# Patient Record
Sex: Female | Born: 1945 | Race: White | Hispanic: No | Marital: Married | Smoking: Former smoker
Health system: Southern US, Community
[De-identification: ages and names within clinical notes are randomized; demographics above are authoritative.]

## PROBLEM LIST (undated history)

## (undated) DIAGNOSIS — A0472 Enterocolitis due to Clostridium difficile, not specified as recurrent: Secondary | ICD-10-CM

## (undated) DIAGNOSIS — K589 Irritable bowel syndrome without diarrhea: Secondary | ICD-10-CM

## (undated) DIAGNOSIS — K635 Polyp of colon: Secondary | ICD-10-CM

## (undated) DIAGNOSIS — E079 Disorder of thyroid, unspecified: Secondary | ICD-10-CM

## (undated) DIAGNOSIS — K449 Diaphragmatic hernia without obstruction or gangrene: Secondary | ICD-10-CM

## (undated) DIAGNOSIS — M797 Fibromyalgia: Secondary | ICD-10-CM

## (undated) DIAGNOSIS — M109 Gout, unspecified: Secondary | ICD-10-CM

## (undated) HISTORY — DX: Gout, unspecified: M10.9

## (undated) HISTORY — DX: Enterocolitis due to Clostridium difficile, not specified as recurrent: A04.72

## (undated) HISTORY — DX: Irritable bowel syndrome without diarrhea: K58.9

## (undated) HISTORY — DX: Disorder of thyroid, unspecified: E07.9

## (undated) HISTORY — DX: Fibromyalgia: M79.7

## (undated) HISTORY — DX: Polyp of colon: K63.5

## (undated) HISTORY — DX: Diaphragmatic hernia without obstruction or gangrene: K44.9

---

## 1989-09-23 HISTORY — PX: DIAGNOSTIC LAPAROSCOPY: SUR761

## 1989-10-23 HISTORY — PX: TOTAL ABDOMINAL HYSTERECTOMY W/ BILATERAL SALPINGOOPHORECTOMY: SHX83

## 1997-11-23 HISTORY — PX: LAPAROSCOPIC CHOLECYSTECTOMY: SUR755

## 1999-11-24 DIAGNOSIS — K589 Irritable bowel syndrome without diarrhea: Secondary | ICD-10-CM

## 1999-11-24 HISTORY — DX: Irritable bowel syndrome, unspecified: K58.9

## 1999-12-29 ENCOUNTER — Other Ambulatory Visit: Admission: RE | Admit: 1999-12-29 | Discharge: 1999-12-29 | Payer: Self-pay | Admitting: *Deleted

## 2000-02-17 ENCOUNTER — Encounter: Admission: RE | Admit: 2000-02-17 | Discharge: 2000-02-17 | Payer: Self-pay | Admitting: Family Medicine

## 2000-02-17 ENCOUNTER — Encounter: Payer: Self-pay | Admitting: Family Medicine

## 2002-04-27 ENCOUNTER — Encounter: Payer: Self-pay | Admitting: Family Medicine

## 2002-04-27 ENCOUNTER — Encounter: Admission: RE | Admit: 2002-04-27 | Discharge: 2002-04-27 | Payer: Self-pay | Admitting: Family Medicine

## 2003-04-04 ENCOUNTER — Other Ambulatory Visit: Admission: RE | Admit: 2003-04-04 | Discharge: 2003-04-04 | Payer: Self-pay | Admitting: *Deleted

## 2003-07-20 ENCOUNTER — Encounter: Payer: Self-pay | Admitting: Family Medicine

## 2003-07-20 ENCOUNTER — Encounter: Admission: RE | Admit: 2003-07-20 | Discharge: 2003-07-20 | Payer: Self-pay | Admitting: Family Medicine

## 2003-09-24 HISTORY — PX: BIOPSY THYROID: PRO38

## 2003-10-08 ENCOUNTER — Encounter: Admission: RE | Admit: 2003-10-08 | Discharge: 2003-10-08 | Payer: Self-pay | Admitting: Family Medicine

## 2003-10-09 ENCOUNTER — Encounter: Admission: RE | Admit: 2003-10-09 | Discharge: 2003-10-09 | Payer: Self-pay | Admitting: Family Medicine

## 2003-10-23 ENCOUNTER — Encounter: Admission: RE | Admit: 2003-10-23 | Discharge: 2003-10-23 | Payer: Self-pay | Admitting: Internal Medicine

## 2003-11-05 ENCOUNTER — Other Ambulatory Visit: Admission: RE | Admit: 2003-11-05 | Discharge: 2003-11-05 | Payer: Self-pay | Admitting: Internal Medicine

## 2003-11-06 ENCOUNTER — Encounter (INDEPENDENT_AMBULATORY_CARE_PROVIDER_SITE_OTHER): Payer: Self-pay | Admitting: *Deleted

## 2003-11-06 ENCOUNTER — Ambulatory Visit (HOSPITAL_COMMUNITY): Admission: RE | Admit: 2003-11-06 | Discharge: 2003-11-06 | Payer: Self-pay | Admitting: Internal Medicine

## 2004-07-30 ENCOUNTER — Ambulatory Visit (HOSPITAL_COMMUNITY): Admission: RE | Admit: 2004-07-30 | Discharge: 2004-07-30 | Payer: Self-pay | Admitting: Gastroenterology

## 2004-08-01 ENCOUNTER — Encounter: Admission: RE | Admit: 2004-08-01 | Discharge: 2004-08-01 | Payer: Self-pay | Admitting: *Deleted

## 2004-09-23 DIAGNOSIS — K635 Polyp of colon: Secondary | ICD-10-CM

## 2004-09-23 HISTORY — DX: Polyp of colon: K63.5

## 2005-09-14 ENCOUNTER — Encounter: Admission: RE | Admit: 2005-09-14 | Discharge: 2005-09-14 | Payer: Self-pay | Admitting: *Deleted

## 2006-04-06 ENCOUNTER — Ambulatory Visit: Payer: Self-pay | Admitting: Hematology & Oncology

## 2006-04-12 ENCOUNTER — Ambulatory Visit: Payer: Self-pay | Admitting: Hematology & Oncology

## 2006-04-12 LAB — CBC WITH DIFFERENTIAL/PLATELET
BASO%: 0.6 % (ref 0.0–2.0)
EOS%: 0.3 % (ref 0.0–7.0)
HCT: 34.5 % — ABNORMAL LOW (ref 34.8–46.6)
HGB: 11.6 g/dL (ref 11.6–15.9)
MCH: 25 pg — ABNORMAL LOW (ref 26.0–34.0)
MCHC: 33.7 g/dL (ref 32.0–36.0)
MCV: 74.4 fL — ABNORMAL LOW (ref 81.0–101.0)
Platelets: 179 10*3/uL (ref 145–400)
lymph#: 0.7 10*3/uL — ABNORMAL LOW (ref 0.9–3.3)

## 2006-04-13 ENCOUNTER — Ambulatory Visit (HOSPITAL_COMMUNITY): Admission: RE | Admit: 2006-04-13 | Discharge: 2006-04-13 | Payer: Self-pay | Admitting: Hematology & Oncology

## 2006-04-15 LAB — FACTOR 9 ASSAY

## 2006-04-15 LAB — LUPUS ANTICOAGULANT PANEL
DRVVT: 56.7 secs — ABNORMAL HIGH (ref 26.75–42.95)
PTT Lupus Anticoagulant: 87.5 secs — ABNORMAL HIGH (ref 30.5–43.1)
PTTLA 4:1 Mix: 80.7 secs — ABNORMAL HIGH (ref 30.5–43.1)

## 2006-04-15 LAB — VON WILLEBRAND PANEL
Factor-VIII Activity: 151 % — ABNORMAL HIGH (ref 75–150)
Ristocetin-Cofactor: >150 % — ABNORMAL HIGH (ref 50–150)

## 2006-04-15 LAB — PTT FACTOR INHIBITOR (MIXING STUDY): Patient 4/1 Immediate Mix: 53 seconds

## 2006-09-15 ENCOUNTER — Encounter: Admission: RE | Admit: 2006-09-15 | Discharge: 2006-09-15 | Payer: Self-pay | Admitting: Obstetrics and Gynecology

## 2008-11-26 ENCOUNTER — Encounter: Admission: RE | Admit: 2008-11-26 | Discharge: 2008-11-26 | Payer: Self-pay | Admitting: Family Medicine

## 2009-08-21 ENCOUNTER — Encounter: Admission: RE | Admit: 2009-08-21 | Discharge: 2009-08-21 | Payer: Self-pay | Admitting: Otolaryngology

## 2009-11-28 ENCOUNTER — Encounter: Admission: RE | Admit: 2009-11-28 | Discharge: 2009-11-28 | Payer: Self-pay | Admitting: Family Medicine

## 2010-12-01 ENCOUNTER — Encounter
Admission: RE | Admit: 2010-12-01 | Discharge: 2010-12-01 | Payer: Self-pay | Source: Home / Self Care | Attending: Family Medicine | Admitting: Family Medicine

## 2010-12-13 ENCOUNTER — Encounter: Payer: Self-pay | Admitting: Family Medicine

## 2010-12-13 ENCOUNTER — Encounter: Payer: Self-pay | Admitting: Internal Medicine

## 2011-09-25 ENCOUNTER — Other Ambulatory Visit: Payer: Self-pay | Admitting: Dermatology

## 2011-11-04 ENCOUNTER — Other Ambulatory Visit: Payer: Self-pay | Admitting: Family Medicine

## 2011-11-04 DIAGNOSIS — Z1231 Encounter for screening mammogram for malignant neoplasm of breast: Secondary | ICD-10-CM

## 2011-12-07 ENCOUNTER — Ambulatory Visit
Admission: RE | Admit: 2011-12-07 | Discharge: 2011-12-07 | Disposition: A | Discharge status: Home / Self Care | Payer: Self-pay | Source: Ambulatory Visit | Attending: Family Medicine | Admitting: Family Medicine

## 2011-12-07 DIAGNOSIS — Z1231 Encounter for screening mammogram for malignant neoplasm of breast: Secondary | ICD-10-CM

## 2012-08-15 ENCOUNTER — Other Ambulatory Visit: Payer: Self-pay | Admitting: Family Medicine

## 2012-08-15 DIAGNOSIS — R269 Unspecified abnormalities of gait and mobility: Secondary | ICD-10-CM

## 2012-08-22 ENCOUNTER — Ambulatory Visit
Admission: RE | Admit: 2012-08-22 | Discharge: 2012-08-22 | Disposition: A | Discharge status: Home / Self Care | Payer: Managed Care, Other (non HMO) | Source: Ambulatory Visit | Attending: Family Medicine | Admitting: Family Medicine

## 2012-08-22 DIAGNOSIS — R269 Unspecified abnormalities of gait and mobility: Secondary | ICD-10-CM

## 2012-10-03 ENCOUNTER — Other Ambulatory Visit: Payer: Self-pay | Admitting: Dermatology

## 2012-11-02 ENCOUNTER — Other Ambulatory Visit: Payer: Self-pay | Admitting: Family Medicine

## 2012-11-02 DIAGNOSIS — Z1231 Encounter for screening mammogram for malignant neoplasm of breast: Secondary | ICD-10-CM

## 2012-12-02 ENCOUNTER — Other Ambulatory Visit: Payer: Self-pay | Admitting: Dermatology

## 2012-12-12 ENCOUNTER — Ambulatory Visit: Payer: Managed Care, Other (non HMO)

## 2013-01-02 ENCOUNTER — Ambulatory Visit
Admission: RE | Admit: 2013-01-02 | Discharge: 2013-01-02 | Disposition: A | Discharge status: Home / Self Care | Payer: Managed Care, Other (non HMO) | Source: Ambulatory Visit | Attending: Family Medicine | Admitting: Family Medicine

## 2013-01-02 DIAGNOSIS — Z1231 Encounter for screening mammogram for malignant neoplasm of breast: Secondary | ICD-10-CM

## 2013-05-03 ENCOUNTER — Ambulatory Visit: Payer: Self-pay | Admitting: Obstetrics and Gynecology

## 2013-05-03 ENCOUNTER — Ambulatory Visit: Payer: Self-pay | Admitting: Gynecology

## 2013-05-31 ENCOUNTER — Encounter: Payer: Self-pay | Admitting: Obstetrics and Gynecology

## 2013-06-02 ENCOUNTER — Other Ambulatory Visit (INDEPENDENT_AMBULATORY_CARE_PROVIDER_SITE_OTHER): Payer: Managed Care, Other (non HMO)

## 2013-06-02 ENCOUNTER — Other Ambulatory Visit: Payer: Self-pay | Admitting: *Deleted

## 2013-06-02 ENCOUNTER — Ambulatory Visit: Payer: Self-pay | Admitting: Obstetrics and Gynecology

## 2013-06-02 DIAGNOSIS — E039 Hypothyroidism, unspecified: Secondary | ICD-10-CM

## 2013-06-02 LAB — TSH: TSH: 1.39 u[IU]/mL (ref 0.35–5.50)

## 2013-06-02 LAB — T4, FREE: Free T4: 1.15 ng/dL (ref 0.60–1.60)

## 2013-06-15 ENCOUNTER — Other Ambulatory Visit: Payer: Self-pay | Admitting: Nurse Practitioner

## 2013-06-15 NOTE — Telephone Encounter (Signed)
Aex scheduled for 06/23/13 with Dr. Farrel Gobble #[id number] sent to last pt until Aex.

## 2013-06-16 ENCOUNTER — Encounter: Payer: Self-pay | Admitting: Endocrinology

## 2013-06-16 ENCOUNTER — Ambulatory Visit (INDEPENDENT_AMBULATORY_CARE_PROVIDER_SITE_OTHER): Payer: Managed Care, Other (non HMO) | Admitting: Endocrinology

## 2013-06-16 VITALS — BP 118/78 | HR 75 | Temp 97.9°F | Resp 12 | Ht 63.0 in | Wt 196.2 lb

## 2013-06-16 DIAGNOSIS — E890 Postprocedural hypothyroidism: Secondary | ICD-10-CM | POA: Insufficient documentation
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
## 2013-06-16 NOTE — Patient Instructions (Addendum)
Lab Results  Component Value Date   TSH 1.39 06/02/2013   No change in Rx

## 2013-06-23 ENCOUNTER — Ambulatory Visit (INDEPENDENT_AMBULATORY_CARE_PROVIDER_SITE_OTHER): Payer: Managed Care, Other (non HMO) | Admitting: Gynecology

## 2013-06-23 ENCOUNTER — Ambulatory Visit: Payer: Self-pay | Admitting: Obstetrics and Gynecology

## 2013-06-23 ENCOUNTER — Encounter: Payer: Self-pay | Admitting: Gynecology

## 2013-06-23 VITALS — BP 122/60 | HR 80 | Resp 16 | Ht 63.0 in | Wt 197.0 lb

## 2013-06-23 DIAGNOSIS — Z0000 Encounter for general adult medical examination without abnormal findings: Secondary | ICD-10-CM

## 2013-06-23 DIAGNOSIS — Z01419 Encounter for gynecological examination (general) (routine) without abnormal findings: Secondary | ICD-10-CM

## 2013-06-23 DIAGNOSIS — N951 Menopausal and female climacteric states: Secondary | ICD-10-CM

## 2013-06-23 LAB — POCT URINALYSIS DIPSTICK
Leukocytes, UA: NEGATIVE
Urobilinogen, UA: NEGATIVE
pH, UA: 5

## 2013-06-23 MED ORDER — ESTRADIOL 0.025 MG/24HR TD PTWK: 1.0000 | MEDICATED_PATCH | TRANSDERMAL | Status: DC
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
## 2013-06-23 NOTE — Patient Instructions (Signed)

## 2013-06-26 ENCOUNTER — Ambulatory Visit: Payer: Self-pay | Admitting: Obstetrics and Gynecology

## 2013-07-17 ENCOUNTER — Other Ambulatory Visit: Payer: Self-pay | Admitting: *Deleted

## 2013-07-17 MED ORDER — LEVOTHYROXINE SODIUM 100 MCG PO TABS: 100.0000 ug | ORAL_TABLET | Freq: Every day | ORAL | Status: DC

## 2013-09-05 ENCOUNTER — Encounter: Payer: Self-pay | Admitting: Gynecology

## 2013-10-02 ENCOUNTER — Other Ambulatory Visit: Payer: Self-pay | Admitting: Dermatology

## 2013-11-24 ENCOUNTER — Other Ambulatory Visit: Payer: Self-pay | Admitting: Family Medicine

## 2013-11-24 DIAGNOSIS — R109 Unspecified abdominal pain: Secondary | ICD-10-CM

## 2013-11-29 ENCOUNTER — Other Ambulatory Visit: Payer: Self-pay

## 2013-11-29 DIAGNOSIS — Z1231 Encounter for screening mammogram for malignant neoplasm of breast: Secondary | ICD-10-CM

## 2013-12-07 ENCOUNTER — Other Ambulatory Visit: Payer: Managed Care, Other (non HMO)

## 2014-01-03 ENCOUNTER — Ambulatory Visit: Payer: Managed Care, Other (non HMO)

## 2014-01-08 ENCOUNTER — Ambulatory Visit: Payer: Managed Care, Other (non HMO)

## 2014-01-08 ENCOUNTER — Other Ambulatory Visit: Payer: Managed Care, Other (non HMO)

## 2014-01-18 ENCOUNTER — Ambulatory Visit: Payer: Managed Care, Other (non HMO)

## 2014-01-22 ENCOUNTER — Telehealth: Payer: Self-pay | Admitting: Orthopedic Surgery

## 2014-01-22 NOTE — Telephone Encounter (Signed)
Spoke with pt about an appt to discuss alternative meds to Climara, and pt prefers to stay with Climara, as it is working so well without side effects. Pt does not want to take a pill and does not want generics. Pt would like us to try calling or faxing a request for appeal so she might continue with Climara.  If pt must come in to discuss any matters, she would prefer to see SM. She is a former pt of [facility] and has seen TL for last Aex, but reports personalities "did not jive."

## 2014-01-22 NOTE — Telephone Encounter (Signed)
LMTCB for appt to discuss alternative medication.

## 2014-01-23 NOTE — Telephone Encounter (Signed)
Spoke with pt to advise that she will need to come in for a brief consult visit to go over risks vs. benefits of HRT before we can submit the PA for Climara. Advised that pt would need to see TL for this appt as SM has never seen pt before, and TL did her last Aex.  Pt understands and is agreeable. Offered pt appt this week, but pt [place] and she cannot come. First available appt that works for pt is 02-09-14 at 8 am.

## 2014-01-25 ENCOUNTER — Telehealth: Payer: Self-pay | Admitting: Emergency Medicine

## 2014-01-25 ENCOUNTER — Ambulatory Visit
Admission: RE | Admit: 2014-01-25 | Discharge: 2014-01-25 | Disposition: A | Discharge status: Home / Self Care | Payer: Medicare Other | Source: Ambulatory Visit

## 2014-01-25 ENCOUNTER — Ambulatory Visit
Admission: RE | Admit: 2014-01-25 | Discharge: 2014-01-25 | Disposition: A | Discharge status: Home / Self Care | Payer: Medicare Other | Source: Ambulatory Visit | Attending: Family Medicine | Admitting: Family Medicine

## 2014-01-25 DIAGNOSIS — Z1231 Encounter for screening mammogram for malignant neoplasm of breast: Secondary | ICD-10-CM

## 2014-01-25 DIAGNOSIS — R109 Unspecified abdominal pain: Secondary | ICD-10-CM

## 2014-01-25 MED ORDER — IOHEXOL 300 MG/ML  SOLN
100.0000 mL | Freq: Once | INTRAMUSCULAR | Status: AC | PRN
  Administered 2014-01-25: 100 mL via INTRAVENOUS

## 2014-01-25 NOTE — Telephone Encounter (Signed)
No need for appt.  Prior auth done and will probably be approved.  AEX in Aug is fine.  Thanks.

## 2014-01-25 NOTE — Telephone Encounter (Signed)
Dr. Hyacinth MeekerMiller, I called patient from your written note on her letter and spoke with her. I did not realize there was a message from Dr. Farrel GobbleLathrop to Amy to call patient and have her have office visit with her to discuss continuing with Climara patch.  Patient is scheduled with Dr. Farrel GobbleLathrop but she would like to see you only, if she has to come in to discuss staying on Climara. Patient requests to cancel appointment with Dr. Farrel GobbleLathrop.  I have advised that you are in receipt of her letter and that the prior authorization is sent. Okay for patient to come in, in August for her AEX to discuss with you?

## 2014-01-25 NOTE — Telephone Encounter (Signed)
Appt cancelled, patient aware, will call back with update on prior Auth.

## 2014-01-30 NOTE — Telephone Encounter (Signed)
Prior authorization approved from 01/25/14-11/22/14, patient aware, aware of aex with Dr. Hyacinth MeekerMiller in 06/29/14.   Routing to provider for final review. Patient agreeable to disposition. Will close encounter

## 2014-02-09 ENCOUNTER — Institutional Professional Consult (permissible substitution): Payer: Managed Care, Other (non HMO) | Admitting: Gynecology

## 2014-06-12 ENCOUNTER — Other Ambulatory Visit: Payer: Self-pay | Admitting: Endocrinology

## 2014-06-14 ENCOUNTER — Encounter: Payer: Self-pay | Admitting: Endocrinology

## 2014-06-14 ENCOUNTER — Ambulatory Visit (INDEPENDENT_AMBULATORY_CARE_PROVIDER_SITE_OTHER): Payer: Medicare Other | Admitting: Endocrinology

## 2014-06-14 VITALS — BP 131/79 | HR 69 | Temp 98.1°F | Resp 16 | Ht 63.0 in | Wt 190.4 lb

## 2014-06-14 DIAGNOSIS — E890 Postprocedural hypothyroidism: Secondary | ICD-10-CM

## 2014-06-14 LAB — T4, FREE: Free T4: 1.27 ng/dL (ref 0.60–1.60)

## 2014-06-14 LAB — TSH: TSH: 0.07 u[IU]/mL — AB (ref 0.35–4.50)

## 2014-06-14 NOTE — Progress Notes (Signed)
Quick Note:  Thyroid level is high and will need to reduce her dose to 88 mcg, she needs to come in for TSH level only in 6 weeks ______
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
## 2014-06-15 ENCOUNTER — Other Ambulatory Visit: Payer: Self-pay | Admitting: *Deleted

## 2014-06-15 MED ORDER — SYNTHROID 88 MCG PO TABS: 88.0000 ug | ORAL_TABLET | Freq: Every day | ORAL | Status: DC

## 2014-06-22 ENCOUNTER — Ambulatory Visit: Payer: Managed Care, Other (non HMO) | Admitting: Endocrinology

## 2014-06-25 ENCOUNTER — Telehealth: Payer: Self-pay | Admitting: Endocrinology

## 2014-06-25 NOTE — Telephone Encounter (Signed)
error 

## 2014-06-28 ENCOUNTER — Ambulatory Visit: Payer: Managed Care, Other (non HMO) | Admitting: Gynecology

## 2014-06-29 ENCOUNTER — Encounter: Payer: Self-pay | Admitting: Obstetrics & Gynecology

## 2014-06-29 ENCOUNTER — Ambulatory Visit (INDEPENDENT_AMBULATORY_CARE_PROVIDER_SITE_OTHER): Payer: Medicare Other | Admitting: Obstetrics & Gynecology

## 2014-06-29 ENCOUNTER — Ambulatory Visit: Payer: Managed Care, Other (non HMO) | Admitting: Gynecology

## 2014-06-29 VITALS — BP 106/68 | HR 60 | Resp 16 | Ht 63.0 in | Wt 189.0 lb

## 2014-06-29 DIAGNOSIS — Z0000 Encounter for general adult medical examination without abnormal findings: Secondary | ICD-10-CM

## 2014-06-29 DIAGNOSIS — E2839 Other primary ovarian failure: Secondary | ICD-10-CM

## 2014-06-29 DIAGNOSIS — Z01419 Encounter for gynecological examination (general) (routine) without abnormal findings: Secondary | ICD-10-CM

## 2014-06-29 DIAGNOSIS — E7800 Pure hypercholesterolemia, unspecified: Secondary | ICD-10-CM

## 2014-06-29 DIAGNOSIS — N951 Menopausal and female climacteric states: Secondary | ICD-10-CM

## 2014-06-29 LAB — COMPREHENSIVE METABOLIC PANEL
ALT: 11 U/L (ref 0–35)
AST: 11 U/L (ref 0–37)
Albumin: 4.1 g/dL (ref 3.5–5.2)
Alkaline Phosphatase: 103 U/L (ref 39–117)
BUN: 13 mg/dL (ref 6–23)
CO2: 25 mEq/L (ref 19–32)
CREATININE: 0.78 mg/dL (ref 0.50–1.10)
Calcium: 8.9 mg/dL (ref 8.4–10.5)
Chloride: 105 mEq/L (ref 96–112)
Glucose, Bld: 81 mg/dL (ref 70–99)
Potassium: 4 mEq/L (ref 3.5–5.3)
Sodium: 138 mEq/L (ref 135–145)
TOTAL PROTEIN: 6.4 g/dL (ref 6.0–8.3)
Total Bilirubin: 0.3 mg/dL (ref 0.2–1.2)

## 2014-06-29 LAB — POCT URINALYSIS DIPSTICK
Bilirubin, UA: NEGATIVE
GLUCOSE UA: NEGATIVE
Ketones, UA: NEGATIVE
Leukocytes, UA: NEGATIVE
NITRITE UA: NEGATIVE
Protein, UA: NEGATIVE
RBC UA: NEGATIVE
Urobilinogen, UA: NEGATIVE
pH, UA: 5

## 2014-06-29 LAB — LIPID PANEL
CHOLESTEROL: 209 mg/dL — AB (ref 0–200)
HDL: 44 mg/dL (ref >39)
LDL CALC: 126 mg/dL — AB (ref 0–99)
TRIGLYCERIDES: 194 mg/dL — AB (ref <150)
Total CHOL/HDL Ratio: 4.8 Ratio
VLDL: 39 mg/dL (ref 0–40)

## 2014-06-29 LAB — HEMOGLOBIN, FINGERSTICK: Hemoglobin, fingerstick: 12 g/dL (ref 12.0–16.0)

## 2014-06-29 MED ORDER — ESTRADIOL 0.025 MG/24HR TD PTWK: 0.0250 mg | MEDICATED_PATCH | TRANSDERMAL | Status: DC
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
## 2014-07-18 ENCOUNTER — Telehealth: Payer: Self-pay | Admitting: Obstetrics & Gynecology

## 2014-07-18 NOTE — Telephone Encounter (Signed)
Patient canceled her upcoming lab appointment 07/19/14 and will call later to reschedule. Patient is out of town.

## 2014-07-19 ENCOUNTER — Other Ambulatory Visit: Payer: Medicare Other

## 2014-07-24 IMAGING — CT CT ABD-PELV W/ CM
2 of 5 series · 17 of 46 positions shown, 19 images · IV contrast (READICAT/WATER & [ID] OMNI 300)
Comparison: Renal ultrasound 05/15/2009

CLINICAL DATA: Abdominal pain, right upper quadrant pain for 9
months, status post hysterectomy with oophorectomy, appendectomy,
cholecystectomy.

EXAM:
CT ABDOMEN AND PELVIS WITH CONTRAST
TECHNIQUE: Multidetector CT imaging of the abdomen and pelvis was performed
using the standard protocol following bolus administration of
intravenous contrast.
CONTRAST:  100mL OMNIPAQUE IOHEXOL 300 MG/ML  SOLN

[Series 2: abd/pelvis with · axial · 0.76mm/px · z∈[-392,-27]mm · 14 of 83 slices shown, 16 images]
[im 5/83  soft-tissue]
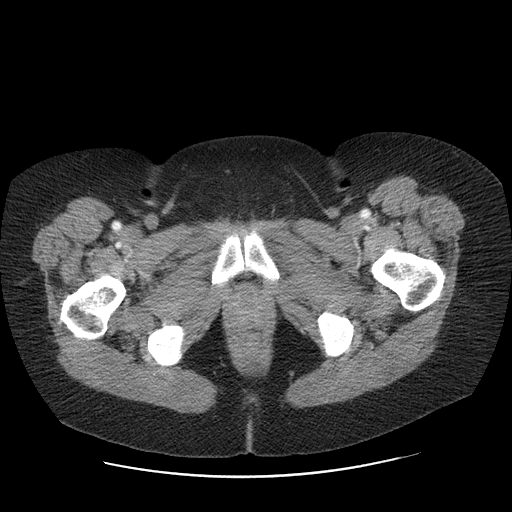
[im 5/83  bone]
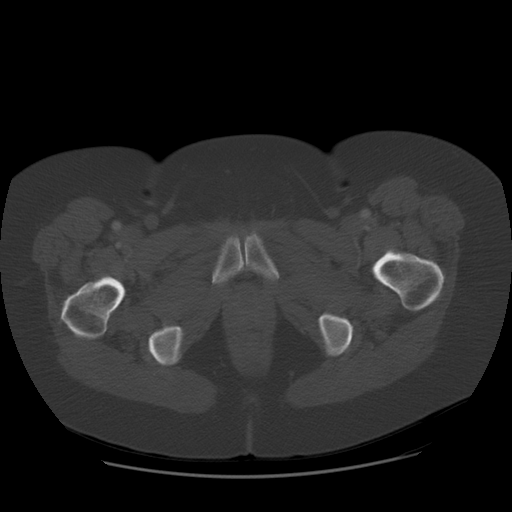
[im 10/83  soft-tissue]
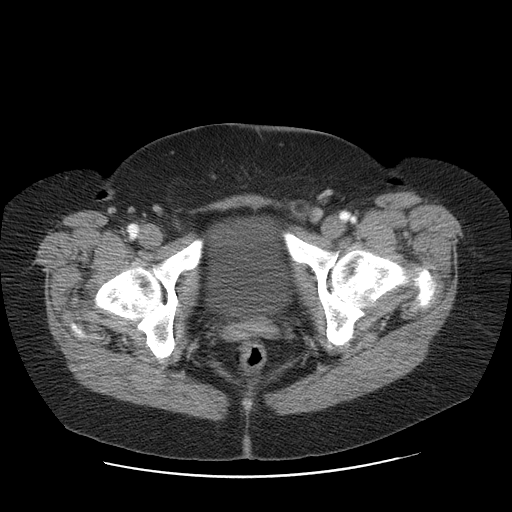
[im 15/83  soft-tissue]
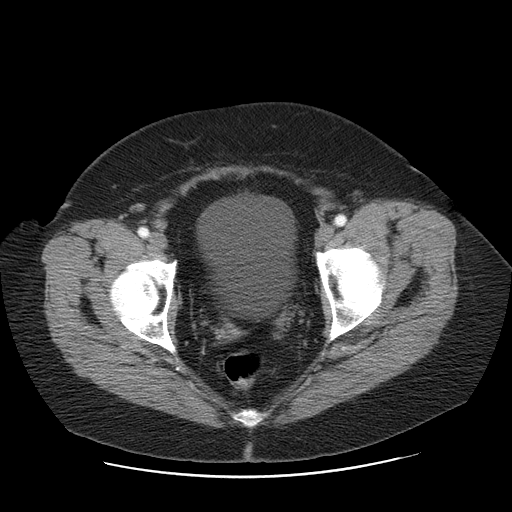
[im 25/83  soft-tissue]
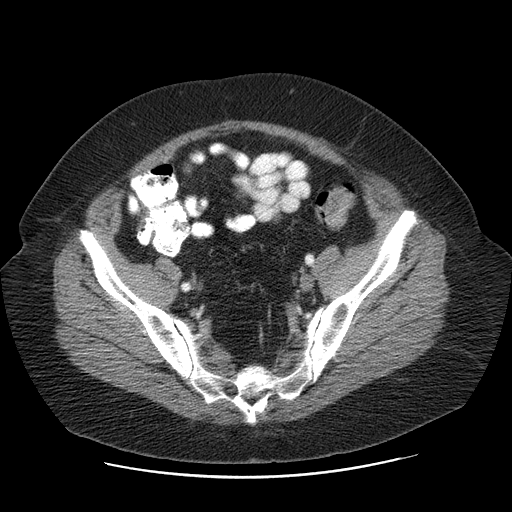
[im 29/83  soft-tissue]
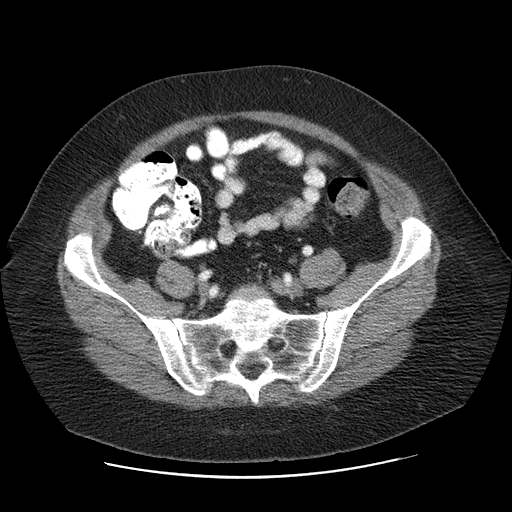
[im 34/83  soft-tissue]
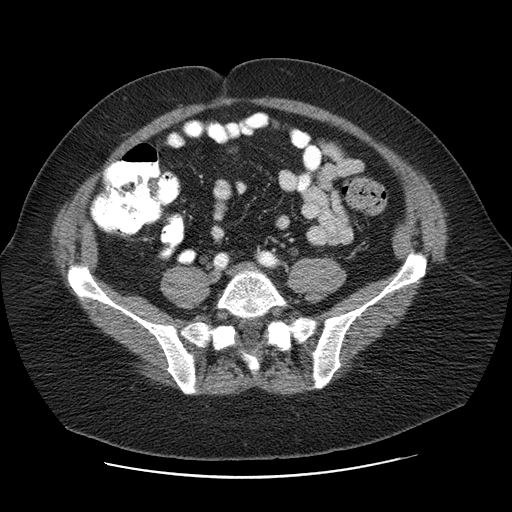
[im 39/83  soft-tissue]
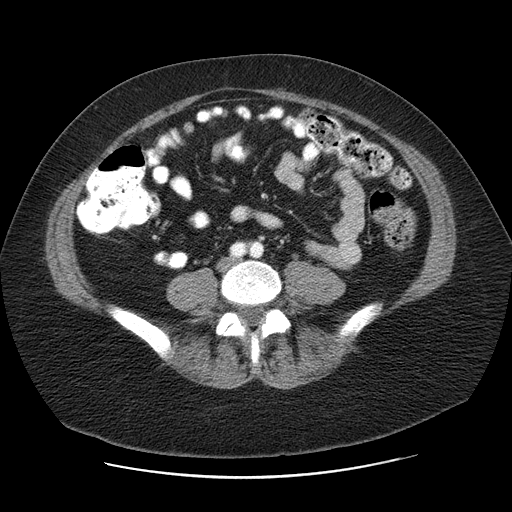
[im 44/83  soft-tissue]
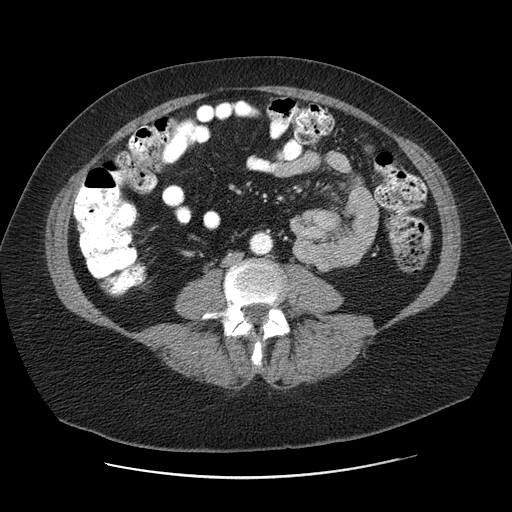
[im 49/83  soft-tissue]
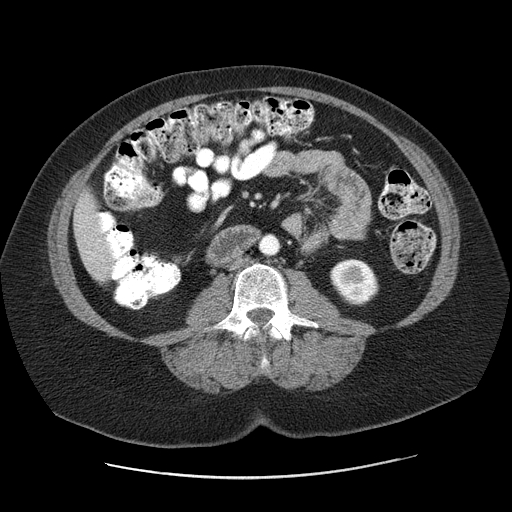
[im 49/83  bone]
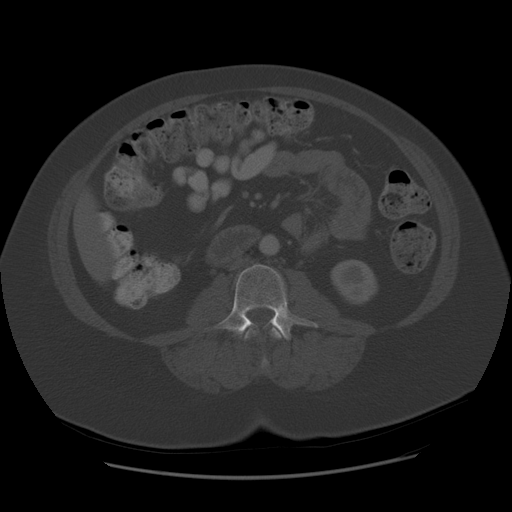
[im 54/83  soft-tissue]
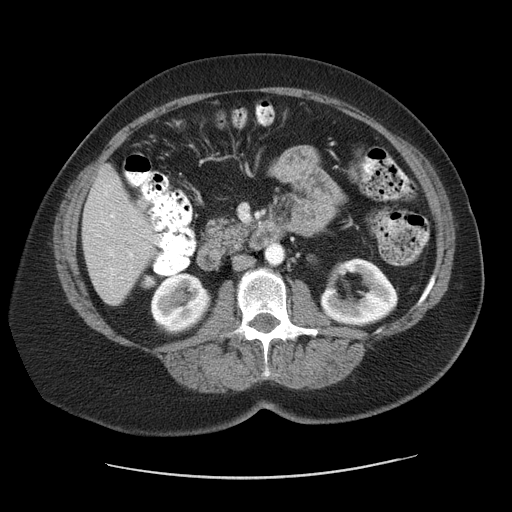
[im 63/83  soft-tissue]
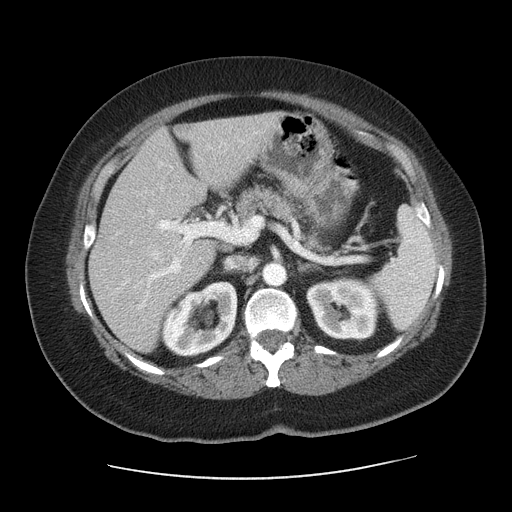
[im 68/83  soft-tissue]
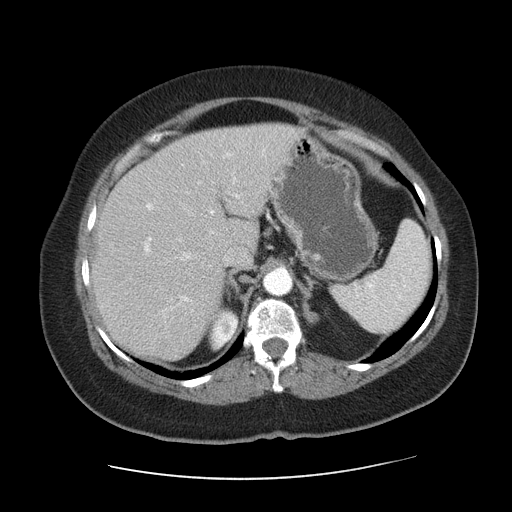
[im 73/83  soft-tissue]
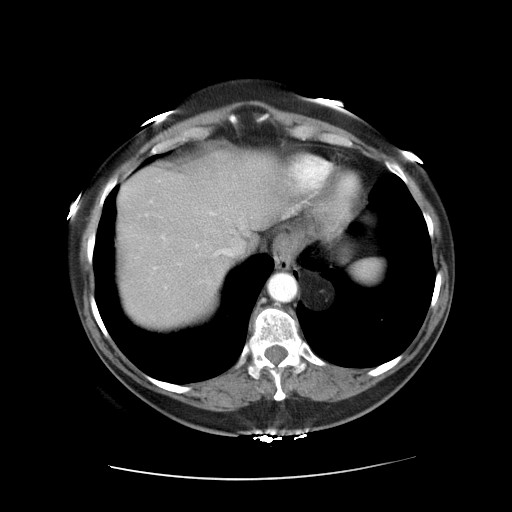
[im 78/83  soft-tissue]
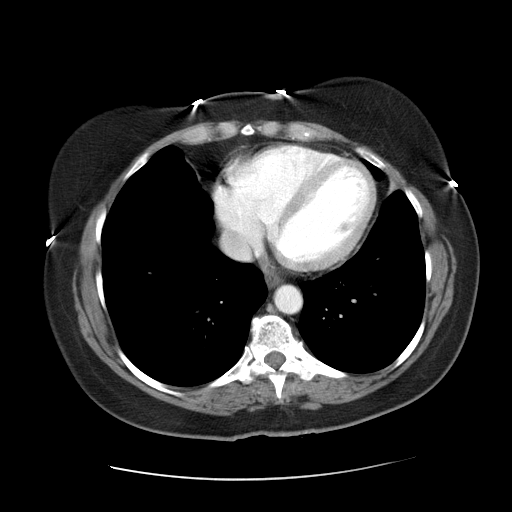

[Series 400: cor · coronal · 0.86mm/px · 3 of 145 slices shown]
[im 49/145  soft-tissue]
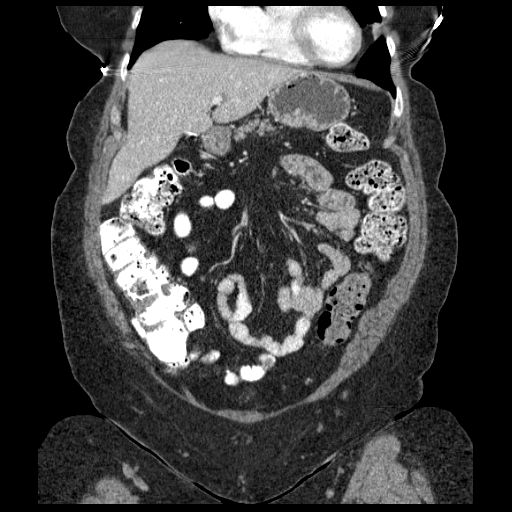
[im 65/145  soft-tissue]
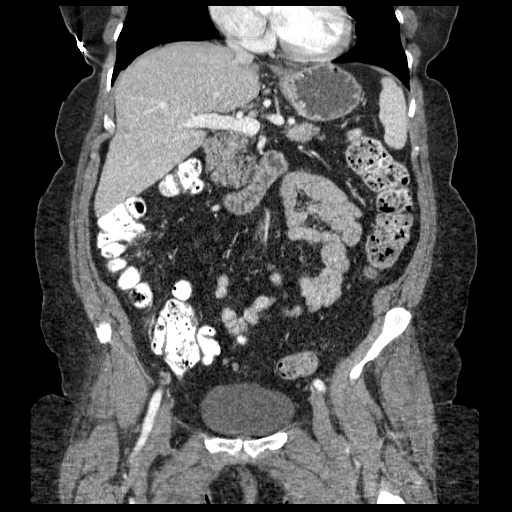
[im 81/145  soft-tissue]
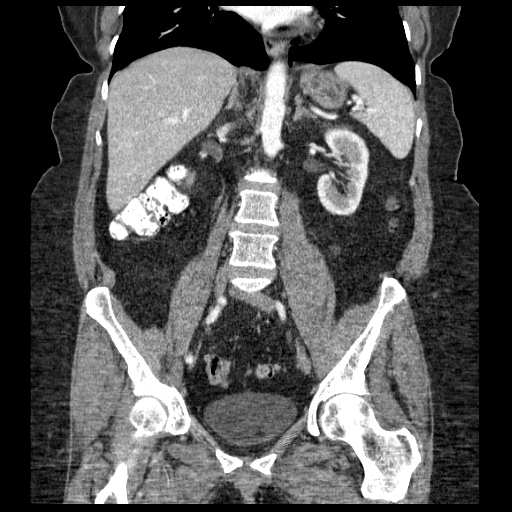

[17 of 46 positions shown; findings below may reference images not displayed]

FINDINGS: Lung bases are unremarkable. Minimal atelectasis or scarring left
base anteriorly.

Sagittal images of the spine are unremarkable. Degenerative changes
are noted lower thoracic spine. Enhanced liver is unremarkable. The
patient is status postcholecystectomy. Pancreas, spleen and adrenals
are unremarkable. Enhanced kidneys are symmetrical in size. No
hydronephrosis or hydroureter. No aortic aneurysm.

Delayed renal images shows bilateral renal symmetrical excretion.
Bilateral visualized proximal ureter is unremarkable. No small bowel
obstruction. No ascites or free air. No adenopathy. Moderate stool
noted in transverse colon and left colon.

The terminal ileum is unremarkable. No pericecal inflammation.
Sigmoid colon diverticula are noted. No evidence of acute
diverticulitis. The patient is status post hysterectomy. The urinary
bladder is unremarkable. No pelvic ascites or adenopathy. No
inguinal adenopathy. No destructive bony lesions are noted within
pelvis.
IMPRESSION: 1. No acute inflammatory process within abdomen or pelvis.
2. Status post appendectomy.  Status postcholecystectomy.
3. Moderate stool noted in transverse colon and left colon. Sigmoid
colon diverticula are noted. No evidence of acute diverticulitis.
4. No hydronephrosis or hydroureter.
5. Status post hysterectomy.

## 2014-08-13 ENCOUNTER — Other Ambulatory Visit (INDEPENDENT_AMBULATORY_CARE_PROVIDER_SITE_OTHER): Payer: Medicare Other

## 2014-08-13 DIAGNOSIS — E781 Pure hyperglyceridemia: Secondary | ICD-10-CM

## 2014-08-15 ENCOUNTER — Other Ambulatory Visit: Payer: Self-pay | Admitting: Obstetrics & Gynecology

## 2014-08-15 DIAGNOSIS — E781 Pure hyperglyceridemia: Secondary | ICD-10-CM

## 2014-08-15 LAB — LIPID PANEL
Cholesterol: 196 mg/dL (ref 0–200)
HDL: 50 mg/dL (ref >39)
LDL Cholesterol: 117 mg/dL — ABNORMAL HIGH (ref 0–99)
Total CHOL/HDL Ratio: 3.9 Ratio
Triglycerides: 145 mg/dL (ref <150)
VLDL: 29 mg/dL (ref 0–40)

## 2014-08-16 ENCOUNTER — Telehealth: Payer: Self-pay

## 2014-08-16 NOTE — Telephone Encounter (Signed)
Patient notified of all results.//kn 

## 2014-08-16 NOTE — Telephone Encounter (Signed)
9/24 lmtcb//kn

## 2014-08-16 NOTE — Telephone Encounter (Signed)
Patient is returning a call to Kelly °

## 2014-08-16 NOTE — Telephone Encounter (Signed)
Message copied by Elisha Headland on Thu Aug 16, 2014 11:41 AM ------      Message from: Jerene Bears      Created: Thu Aug 16, 2014  8:27 AM       Inform pt that Lipids are normal except LDLs a little elvated at 117.  TGs normal.  Were 194.  Now 145.  HDLs good.  Ratio fine.  Repeat 1 year but all lipids tests for her should be done fasting. ------

## 2014-08-17 ENCOUNTER — Other Ambulatory Visit (INDEPENDENT_AMBULATORY_CARE_PROVIDER_SITE_OTHER): Payer: Medicare Other

## 2014-08-17 DIAGNOSIS — E890 Postprocedural hypothyroidism: Secondary | ICD-10-CM

## 2014-08-17 LAB — TSH: TSH: 1.16 u[IU]/mL (ref 0.35–4.50)

## 2014-08-17 LAB — T4, FREE: FREE T4: 1.08 ng/dL (ref 0.60–1.60)

## 2014-08-21 NOTE — Progress Notes (Signed)
Quick Note:  Please let patient know that the lab result is normal and no change in dosage ______

## 2014-09-24 ENCOUNTER — Encounter: Payer: Self-pay | Admitting: Obstetrics & Gynecology

## 2014-10-01 ENCOUNTER — Other Ambulatory Visit: Payer: Self-pay | Admitting: Endocrinology

## 2014-12-28 ENCOUNTER — Other Ambulatory Visit: Payer: Self-pay

## 2014-12-28 DIAGNOSIS — Z1231 Encounter for screening mammogram for malignant neoplasm of breast: Secondary | ICD-10-CM

## 2014-12-31 ENCOUNTER — Telehealth: Payer: Self-pay

## 2014-12-31 NOTE — Telephone Encounter (Signed)
Phone call to [facility] to initiate prior authorization for patient's Climara 0.025MG  patches. Awaiting prior authorization form to fill out and be reviewed by MD.

## 2015-01-01 NOTE — Telephone Encounter (Signed)
Signed and back on your desk

## 2015-01-01 NOTE — Telephone Encounter (Signed)
Prior authorization to Dr.Miller's desk for review and signature.

## 2015-01-02 NOTE — Telephone Encounter (Signed)
Prior authorization faxed to [facility] by Sharma CovertStarla Curl with cover sheet and fax confirmation at [phone]. Will await response regarding approval/denial.

## 2015-01-21 NOTE — Telephone Encounter (Signed)
Spoke with Janet DikeJennifer at [facility] who states that prior authorization for patient's Climara 0.025mg  patch was not received on 2/18. Advised will fax again and wait for response.

## 2015-01-21 NOTE — Telephone Encounter (Signed)
Prior authorization for Climara 0.025mg  patch refaxed to [facility] at [phone] with cover sheet and confirmation.

## 2015-02-01 ENCOUNTER — Ambulatory Visit: Payer: Managed Care, Other (non HMO)

## 2015-02-12 ENCOUNTER — Ambulatory Visit
Admission: RE | Admit: 2015-02-12 | Discharge: 2015-02-12 | Disposition: A | Discharge status: Home / Self Care | Payer: Medicare Other | Source: Ambulatory Visit

## 2015-02-12 DIAGNOSIS — Z1231 Encounter for screening mammogram for malignant neoplasm of breast: Secondary | ICD-10-CM

## 2015-02-25 ENCOUNTER — Telehealth: Payer: Self-pay | Admitting: Emergency Medicine

## 2015-02-25 NOTE — Telephone Encounter (Signed)
Message left to return call to [facility] at [phone].   Received prior authorization request from pharmacy with request that patient only wants brand only Climara and requires a prior authorization. Patient has been using Climara since 06/29/06 and tried vivelle dot prior to changing to Climara.  Need to know from patient reason(s) for requesting brand only in order to complete prior authorization.

## 2015-02-25 NOTE — Telephone Encounter (Signed)
Spoke with patient. She states it is her preference for Brand only Janet Farmer. Patient states she has not tried generic and does not wish to as brand has worked well for her. Colgatedvised insurance company may not cover based on her preferences only. She states she will call insurance company directly to discuss and return call if they need further information.   Routing to provider for final review. Patient agreeable to disposition. Will close encounter

## 2015-05-30 ENCOUNTER — Other Ambulatory Visit: Payer: Medicare Other

## 2015-05-30 ENCOUNTER — Other Ambulatory Visit (INDEPENDENT_AMBULATORY_CARE_PROVIDER_SITE_OTHER): Payer: Medicare Other

## 2015-05-30 ENCOUNTER — Other Ambulatory Visit: Payer: Self-pay | Admitting: *Deleted

## 2015-05-30 DIAGNOSIS — E890 Postprocedural hypothyroidism: Secondary | ICD-10-CM

## 2015-05-30 LAB — T4, FREE: Free T4: 1.08 ng/dL (ref 0.60–1.60)

## 2015-05-30 LAB — TSH: TSH: 2 u[IU]/mL (ref 0.35–4.50)

## 2015-06-17 ENCOUNTER — Ambulatory Visit: Payer: Medicare Other | Admitting: Endocrinology

## 2015-06-19 ENCOUNTER — Encounter: Payer: Self-pay | Admitting: Endocrinology

## 2015-06-19 ENCOUNTER — Ambulatory Visit (INDEPENDENT_AMBULATORY_CARE_PROVIDER_SITE_OTHER): Payer: Medicare Other | Admitting: Endocrinology

## 2015-06-19 VITALS — BP 138/88 | HR 70 | Temp 98.1°F | Resp 16 | Ht 63.0 in | Wt 190.2 lb

## 2015-06-19 DIAGNOSIS — E890 Postprocedural hypothyroidism: Secondary | ICD-10-CM

## 2015-06-19 NOTE — Patient Instructions (Signed)
No change 
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
## 2015-07-05 ENCOUNTER — Ambulatory Visit: Payer: Medicare Other | Admitting: Obstetrics & Gynecology

## 2015-07-11 ENCOUNTER — Other Ambulatory Visit: Payer: Self-pay | Admitting: Endocrinology

## 2015-07-19 ENCOUNTER — Ambulatory Visit
Admission: RE | Admit: 2015-07-19 | Discharge: 2015-07-19 | Disposition: A | Discharge status: Home / Self Care | Payer: Medicare Other | Source: Ambulatory Visit | Attending: Family Medicine | Admitting: Family Medicine

## 2015-07-19 ENCOUNTER — Encounter: Payer: Self-pay | Admitting: Obstetrics & Gynecology

## 2015-07-19 ENCOUNTER — Ambulatory Visit (INDEPENDENT_AMBULATORY_CARE_PROVIDER_SITE_OTHER): Payer: Medicare Other | Admitting: Obstetrics & Gynecology

## 2015-07-19 ENCOUNTER — Other Ambulatory Visit: Payer: Self-pay | Admitting: Family Medicine

## 2015-07-19 VITALS — BP 128/68 | HR 64 | Resp 16 | Ht 63.25 in | Wt 192.0 lb

## 2015-07-19 DIAGNOSIS — S40022A Contusion of left upper arm, initial encounter: Secondary | ICD-10-CM

## 2015-07-19 DIAGNOSIS — Z124 Encounter for screening for malignant neoplasm of cervix: Secondary | ICD-10-CM

## 2015-07-19 DIAGNOSIS — Z01419 Encounter for gynecological examination (general) (routine) without abnormal findings: Secondary | ICD-10-CM | POA: Diagnosis not present

## 2015-07-19 DIAGNOSIS — Z0000 Encounter for general adult medical examination without abnormal findings: Secondary | ICD-10-CM
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
## 2015-07-19 NOTE — Progress Notes (Signed)
Bone Density scheduled for 09/19/15 at 1340. Patient given appointment card.

## 2015-07-22 LAB — IPS PAP SMEAR ONLY

## 2015-09-12 ENCOUNTER — Ambulatory Visit
Admission: RE | Admit: 2015-09-12 | Discharge: 2015-09-12 | Disposition: A | Discharge status: Home / Self Care | Payer: Medicare Other | Source: Ambulatory Visit | Attending: Obstetrics & Gynecology | Admitting: Obstetrics & Gynecology

## 2015-09-12 DIAGNOSIS — E2839 Other primary ovarian failure: Secondary | ICD-10-CM

## 2015-09-19 ENCOUNTER — Other Ambulatory Visit: Payer: Medicare Other

## 2015-11-07 ENCOUNTER — Ambulatory Visit
Admission: RE | Admit: 2015-11-07 | Discharge: 2015-11-07 | Disposition: A | Discharge status: Home / Self Care | Payer: Medicare Other | Source: Ambulatory Visit | Attending: Family Medicine | Admitting: Family Medicine

## 2015-11-07 ENCOUNTER — Other Ambulatory Visit: Payer: Self-pay | Admitting: Family Medicine

## 2015-11-07 DIAGNOSIS — M79642 Pain in left hand: Principal | ICD-10-CM

## 2015-11-07 DIAGNOSIS — M79641 Pain in right hand: Secondary | ICD-10-CM

## 2015-11-26 ENCOUNTER — Telehealth: Payer: Self-pay | Admitting: Obstetrics & Gynecology

## 2015-11-26 NOTE — Telephone Encounter (Signed)
Patient returning your call Best # to reach: [phone]

## 2015-11-26 NOTE — Telephone Encounter (Signed)
Left message to call Milana Salay at [phone]. 

## 2015-11-26 NOTE — Telephone Encounter (Signed)
Spoke with patient. Advised of message as seen below from Dr.Miller. Patient is agreeable and verbalizes understanding. Patient states she would like to continue to monitor her symptoms. If symptoms worsen she will call for refill of Climara patches.  Routing to provider for final review. Patient agreeable to disposition. Will close encounter.

## 2015-11-26 NOTE — Telephone Encounter (Signed)
Spoke with patient. Patient states that when she was last seen with Dr.Miller 06/2015 she was advised to start cutting her Climara patches in half to start to wean off of them due to age. Patient used Climara 0.025 mg 1/2 patch once per week until October. States "I was doing really well with half a patch so I decided to try stopping the patches." Patient states that over the last 6-8 weeks her hot flashes have increased. Over the last 2-3 weeks she has been having difficulty sleeping due to night sweats. Patient is asking if she may restart the Climara 0.025 mg 1/2 patch weekly or "Do I need to just wait it out? I will do whatever Dr.Miller tells me I need to do." Advised I will speak with Dr.Miller and return call with further recommendations. Patient is agreeable.

## 2015-11-26 NOTE — Telephone Encounter (Signed)
She can restart if she desires.  If she "waits it out" typically the symptoms do improve if not completley resolve.  Ok to send in rx for pt if needed.

## 2015-11-26 NOTE — Telephone Encounter (Signed)
Patient called and said, "My hot flashes are continuing to get worse. I'd like to speak with the nurse to discuss my medication and maybe start back on the patch I used to use." Pharmacy on file is correct. Patient declined an appointment until speaking with the nurse.

## 2016-01-02 ENCOUNTER — Telehealth: Payer: Self-pay | Admitting: Obstetrics & Gynecology

## 2016-01-02 NOTE — Telephone Encounter (Signed)
Patient would like to see Dr. Hyacinth Meeker to discuss staying off hrt patch, her pcp is faxing over lab work. [phone]

## 2016-01-02 NOTE — Telephone Encounter (Signed)
Spoke with patient. She would like to come in to see Dr.Miller to discuss staying off her HRT patch. Appointment scheduled for 01/09/2016 at 10 am with Dr.Miller. She is agreeable to date and time. States that her Rheumatologist Dr.Hawkes will be faxing over her lab work and OV notes for Dr.Miller's review.   Routing to provider for final review. Patient agreeable to disposition. Will close encounter.

## 2016-01-08 ENCOUNTER — Other Ambulatory Visit: Payer: Self-pay | Admitting: Endocrinology

## 2016-01-09 ENCOUNTER — Encounter: Payer: Self-pay | Admitting: Obstetrics & Gynecology

## 2016-01-09 ENCOUNTER — Ambulatory Visit (INDEPENDENT_AMBULATORY_CARE_PROVIDER_SITE_OTHER): Payer: Medicare Other | Admitting: Obstetrics & Gynecology

## 2016-01-09 VITALS — BP 104/66 | HR 80 | Resp 16 | Ht 63.25 in | Wt 197.0 lb

## 2016-01-09 DIAGNOSIS — R5382 Chronic fatigue, unspecified: Secondary | ICD-10-CM

## 2016-01-09 DIAGNOSIS — N951 Menopausal and female climacteric states: Secondary | ICD-10-CM

## 2016-01-09 DIAGNOSIS — F39 Unspecified mood [affective] disorder: Secondary | ICD-10-CM | POA: Diagnosis not present

## 2016-01-09 DIAGNOSIS — R4586 Emotional lability: Secondary | ICD-10-CM

## 2016-01-09 LAB — THYROID PANEL WITH TSH
FREE THYROXINE INDEX: 2.8 (ref 1.4–3.8)
T3 UPTAKE: 31 % (ref 22–35)
T4, Total: 9.1 ug/dL (ref 4.5–12.0)
TSH: 1.71 m[IU]/L

## 2016-01-09 MED ORDER — DULOXETINE HCL 20 MG PO CPEP: 20.0000 mg | ORAL_CAPSULE | Freq: Every day | ORAL | Status: DC

## 2016-01-09 MED ORDER — ESTRADIOL 0.025 MG/24HR TD PTWK: 0.0250 mg | MEDICATED_PATCH | TRANSDERMAL | Status: DC

## 2016-01-09 NOTE — Progress Notes (Signed)
Patient ID: Janet Farmer, female   DOB: 1946/08/14, 70 y.o.   MRN: [id number]   70 yo G2P2 MWF here for discussion of possible menopausal symptoms.  Pt was seen 07/19/15 and we discussed pt trying to wean off her HRT.  She did this successfully but noted in December increased symptoms that she feels are menopausal as well.  She is having some hot flashes but is tolerating these well.  Her biggest issues are worsening joint pain and fatigue.  She feels the fatigue can keep her from doing things she normally would want to do.  She wonders if her thyroid dosage is correct.  She would like to have this tested today.    Pt and I reviewed all the reasons that she stopped her HRT--breast cancer, MI, stroke, DVT/PE.  She states she is willing to accept these risks to feel better.  Pt then goes on to tell me that in December, right before these symptoms started, that she and her husband lost two good friends.  This has been very sad for her.  She states she does not think she is depressed but does feel that influenced her mood and possibly how she feels.  She hadn't connected either other these two things--change in body symptoms and death of friends.  She is not sure they are related.  Given this, however, I feel starting low dose cymbalta a safer and possible more effective alternative to HRT.  Pt willing to try this.  Will start with the  dosage.  Nausea as typical side effect as well as less common side effects and serotonin syndrome discussed.  All questions answered.   Assessment: Grief reaction with recent death of friends Joint/body pain Hot flashes after stopping HRT H/o hypothyroidism Remote hx of TAH/BSO for adhesions and pain 12/90  Plan: TSH with panel Cymbalta  daily.  #30/2RF.  Will plan follow up after lab work results are back.  ~15 minutes spent with patient >50% of time was in face to face discussion of above.

## 2016-01-15 IMAGING — CR DG HUMERUS 2V *L*
2 series · 2 of 2 positions shown · non-contrast
Comparison: None.

CLINICAL DATA: Fall onto left humerus 3 days ago with worsening
pain and difficult range of motion.

EXAM:
LEFT HUMERUS - 2+ VIEW

[w humerus ap left]
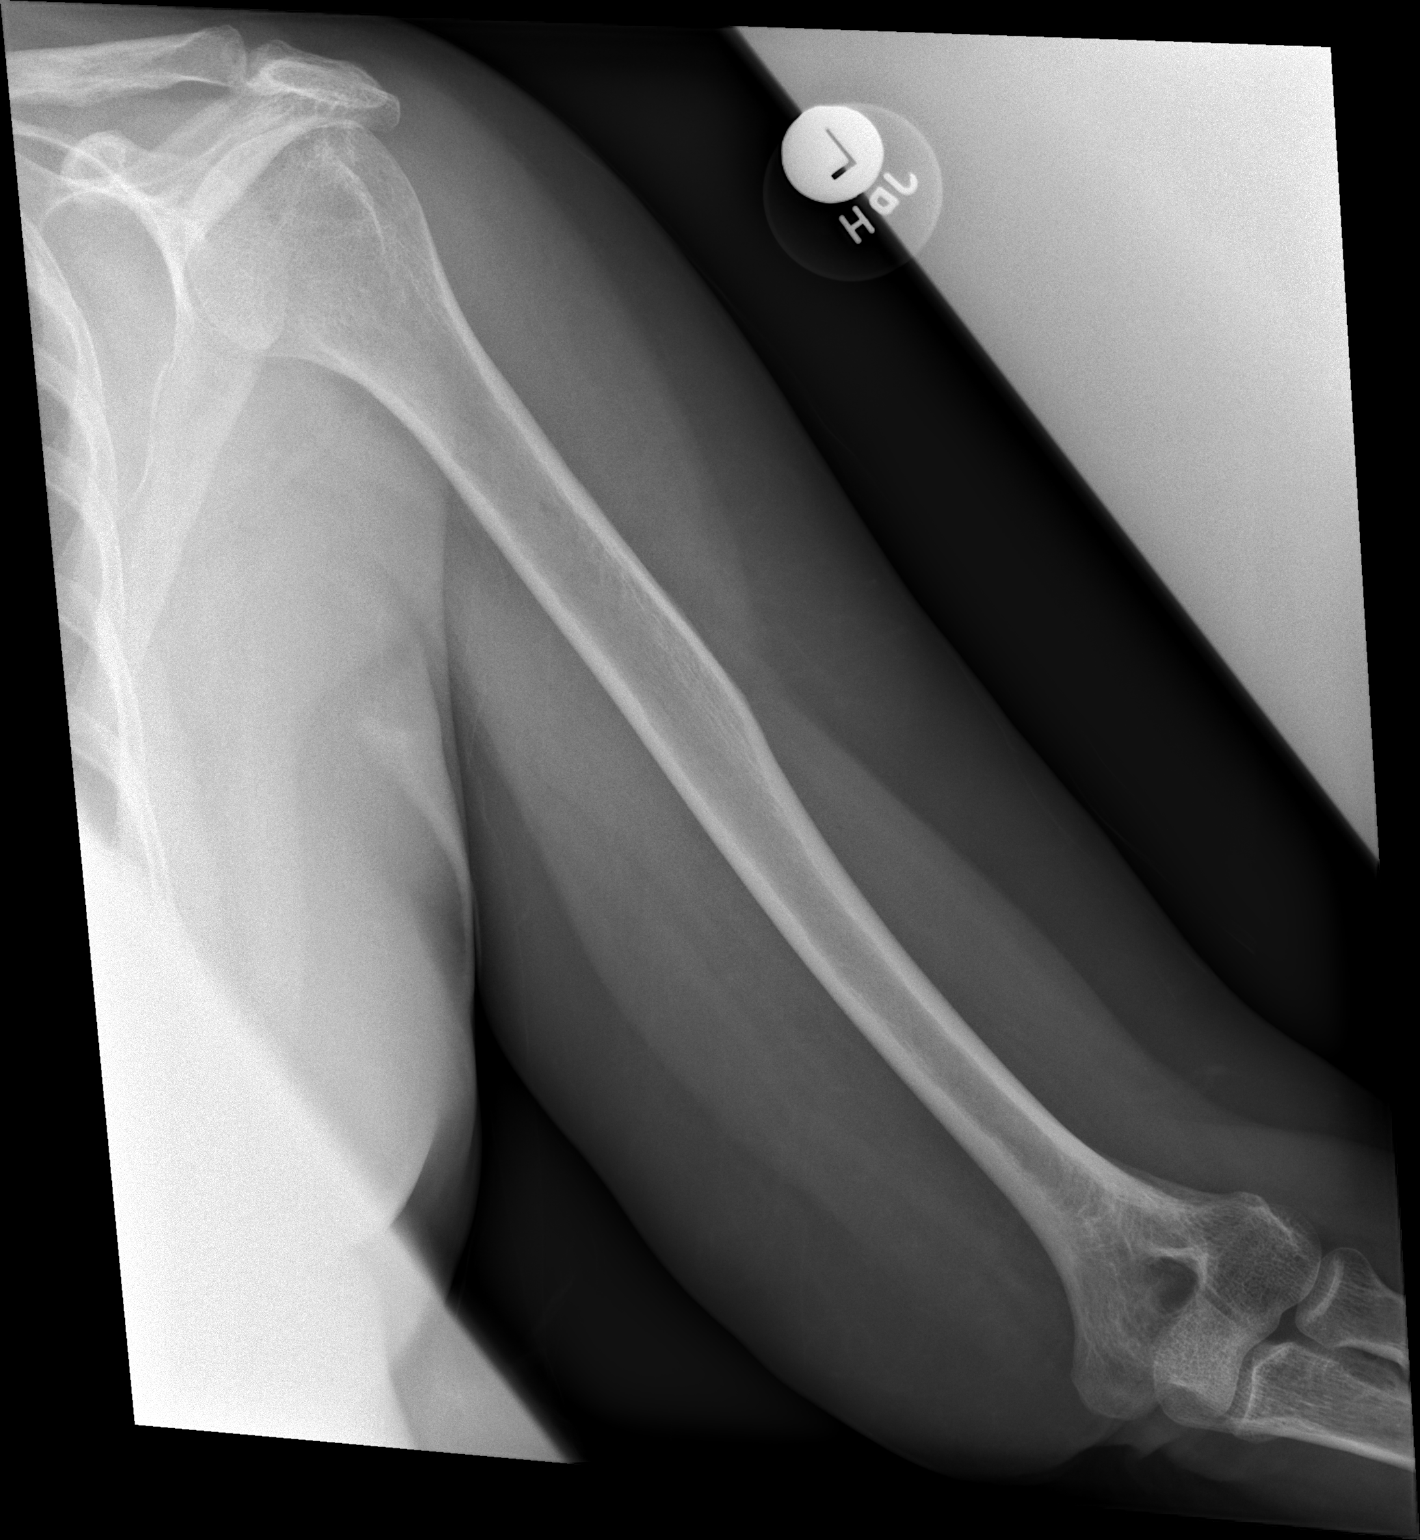

[w humerus lat left]
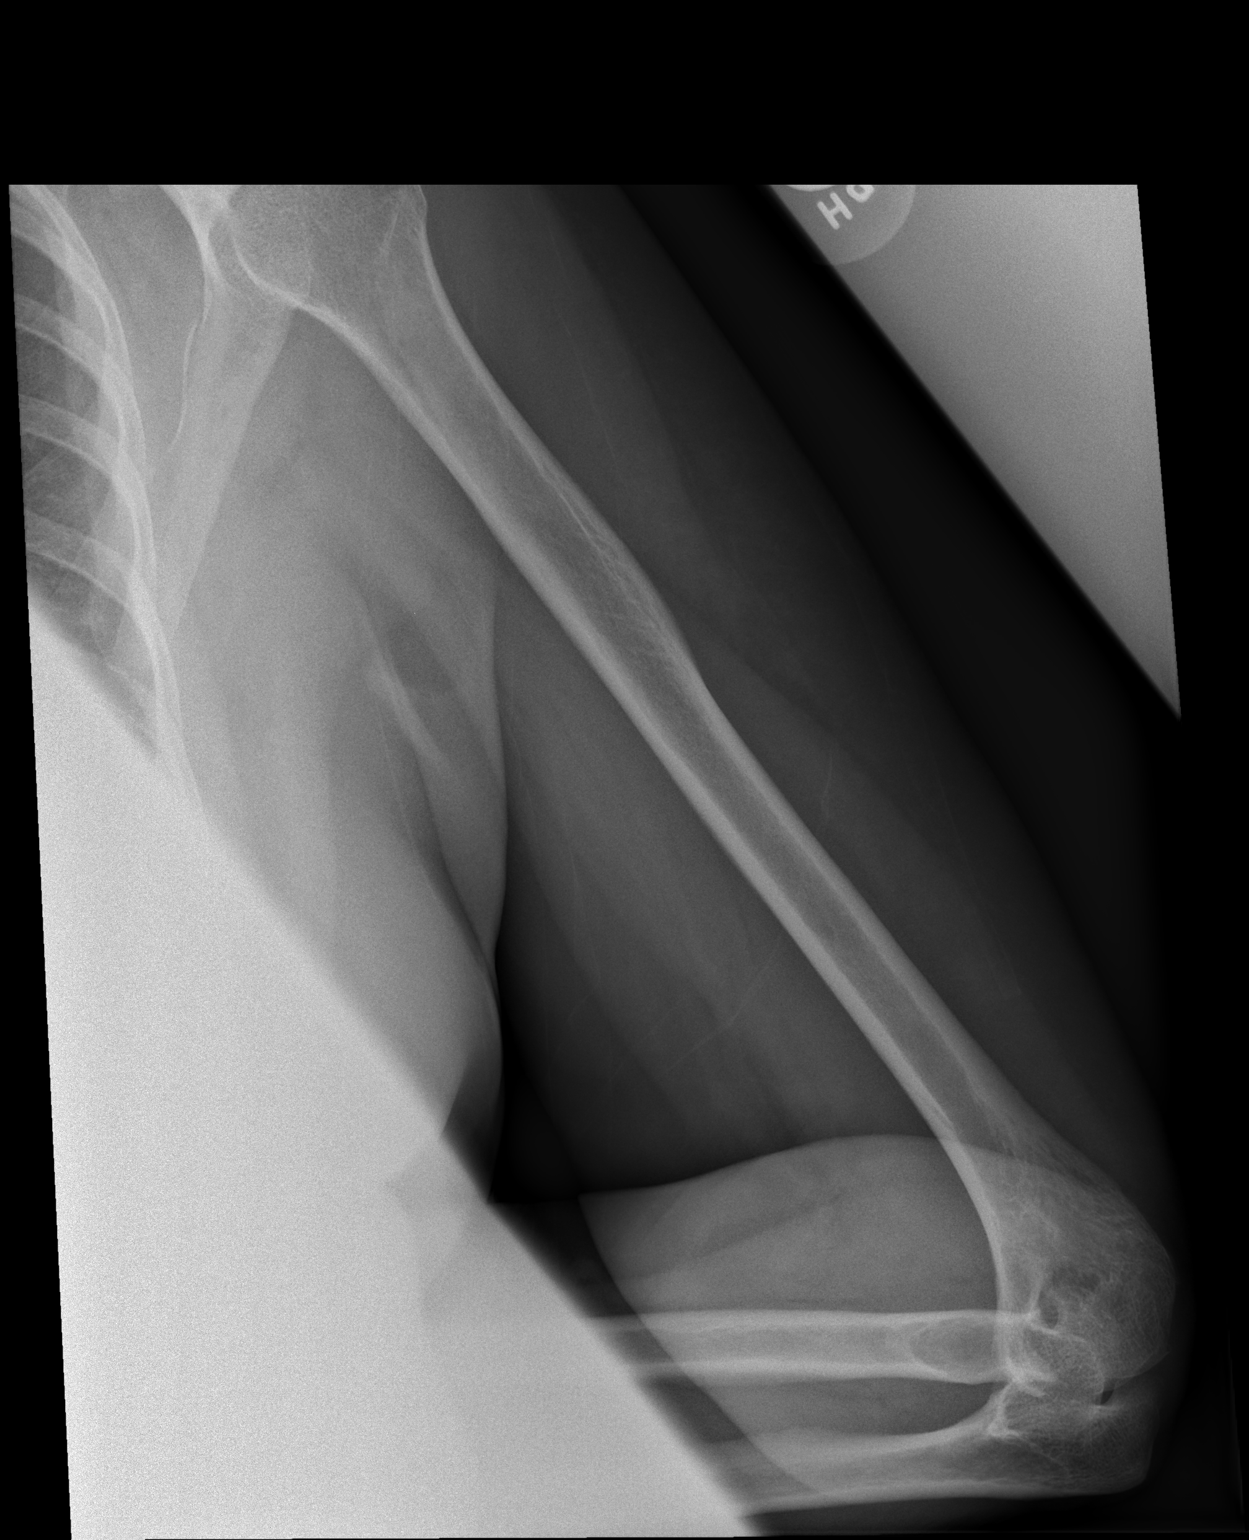

[2 of 2 positions shown; findings below may reference images not displayed]

FINDINGS: There is no evidence of fracture or other focal bone lesions. Soft
tissues are unremarkable.
IMPRESSION: Negative.

## 2016-01-17 ENCOUNTER — Telehealth: Payer: Self-pay | Admitting: Emergency Medicine

## 2016-01-17 NOTE — Telephone Encounter (Signed)
-----   Message from Jerene Bears, MD sent at 01/13/2016  6:23 PM EST ----- Please inform pt thyroid panel was normal.  She started on Cymbalta last week for fatigue, joint pain, possible depression and hot flashes.  If she is doing ok on it, I'd like to see her again in about a month, if possible.  Thanks.

## 2016-01-17 NOTE — Telephone Encounter (Signed)
Call to patient. She is given result message from Dr. Hyacinth Meeker.  She had initial dizziness that is improving. She states she is getting energy back and feels like she is not as emotional and that her joint tenderness has decreased slightly.  She is agreeable to follow up with Dr. Hyacinth Meeker and that is scheduled for 02/10/16 at 1300.  Patient advised to call back with any concerns prior to that appointment and she is agreeable.  Routing to provider for final review. Patient agreeable to disposition. Will close encounter.

## 2016-02-10 ENCOUNTER — Encounter: Payer: Self-pay | Admitting: Obstetrics & Gynecology

## 2016-02-10 ENCOUNTER — Ambulatory Visit (INDEPENDENT_AMBULATORY_CARE_PROVIDER_SITE_OTHER): Payer: Medicare Other | Admitting: Obstetrics & Gynecology

## 2016-02-10 VITALS — BP 126/76 | HR 80 | Resp 18 | Ht 63.25 in | Wt 195.0 lb

## 2016-02-10 DIAGNOSIS — N951 Menopausal and female climacteric states: Secondary | ICD-10-CM | POA: Diagnosis not present

## 2016-02-10 DIAGNOSIS — R5382 Chronic fatigue, unspecified: Secondary | ICD-10-CM | POA: Diagnosis not present

## 2016-02-10 DIAGNOSIS — R232 Flushing: Secondary | ICD-10-CM

## 2016-02-10 MED ORDER — DULOXETINE HCL 20 MG PO CPEP: 20.0000 mg | ORAL_CAPSULE | Freq: Every day | ORAL | Status: DC
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
## 2016-02-24 ENCOUNTER — Other Ambulatory Visit: Payer: Self-pay

## 2016-02-24 DIAGNOSIS — Z1231 Encounter for screening mammogram for malignant neoplasm of breast: Secondary | ICD-10-CM

## 2016-03-30 ENCOUNTER — Other Ambulatory Visit: Payer: Self-pay | Admitting: Obstetrics & Gynecology

## 2016-03-30 NOTE — Telephone Encounter (Signed)
02/10/16 #30/12 [facility]rx refused.

## 2016-04-02 ENCOUNTER — Other Ambulatory Visit: Payer: Self-pay | Admitting: Obstetrics & Gynecology

## 2016-04-02 NOTE — Telephone Encounter (Signed)
Patient is asking for asking for a refill of DULoxetine (CYMBALTA) 20 MG capsule. Patient only has two pills left and the pharmacy told her they have two request with no response. Confirmed pharmacy with patient.

## 2016-04-02 NOTE — Telephone Encounter (Signed)
Medication refill request: Cymbalta  Last AEX:  07-18-16  Next AEX: 11-05-16 Last MMG (if hormonal medication request): 02-12-15 WNL  Refill authorized: please advise

## 2016-04-03 NOTE — Telephone Encounter (Signed)
Left message on patient's vm that rx has been sent to pharmacy.

## 2016-04-10 ENCOUNTER — Ambulatory Visit
Admission: RE | Admit: 2016-04-10 | Discharge: 2016-04-10 | Disposition: A | Discharge status: Home / Self Care | Payer: Medicare Other | Source: Ambulatory Visit

## 2016-04-10 DIAGNOSIS — Z1231 Encounter for screening mammogram for malignant neoplasm of breast: Secondary | ICD-10-CM

## 2016-06-05 ENCOUNTER — Telehealth: Payer: Self-pay | Admitting: Endocrinology

## 2016-06-05 NOTE — Telephone Encounter (Signed)
ok 

## 2016-06-05 NOTE — Telephone Encounter (Signed)
PT wanted to ok with you that she can do labs and visit in one visit, she no longer lives here and does not want to travel that far for labs separately.

## 2016-06-08 NOTE — Telephone Encounter (Signed)
LVM for PT letting her know.

## 2016-06-12 ENCOUNTER — Other Ambulatory Visit: Payer: Medicare Other

## 2016-06-22 ENCOUNTER — Telehealth: Payer: Self-pay | Admitting: Endocrinology

## 2016-06-22 NOTE — Telephone Encounter (Signed)
Patient stated that insurance company will be faxing over form so that patient can keep taking her medication synthroid Instead of making her change to a generic brand.

## 2016-06-24 NOTE — Telephone Encounter (Signed)
Fax received and placed on Dr. Remus Blake desk to review.

## 2016-06-30 ENCOUNTER — Ambulatory Visit (INDEPENDENT_AMBULATORY_CARE_PROVIDER_SITE_OTHER): Payer: Medicare Other | Admitting: Endocrinology

## 2016-06-30 ENCOUNTER — Encounter: Payer: Self-pay | Admitting: Endocrinology

## 2016-06-30 ENCOUNTER — Telehealth: Payer: Self-pay | Admitting: Endocrinology

## 2016-06-30 VITALS — BP 132/86 | HR 68 | Ht 63.0 in | Wt 194.0 lb

## 2016-06-30 DIAGNOSIS — E890 Postprocedural hypothyroidism: Secondary | ICD-10-CM

## 2016-06-30 LAB — TSH: TSH: 3.26 u[IU]/mL (ref 0.35–4.50)

## 2016-06-30 LAB — T4, FREE: FREE T4: 0.93 ng/dL (ref 0.60–1.60)

## 2016-06-30 NOTE — Telephone Encounter (Signed)
PT stated she does not want to participate in the online pharmacy that Dr. Lucianne MussKumar suggested, she can get Synthroid cheaper at her local pharmacy.  [facility], [phone]

## 2016-06-30 NOTE — Telephone Encounter (Signed)
Her thyroid level is okay and she can continue 88 g Synthroid.  I cannot support using brand name with any information so cannot do a PA.  If she wants to shop around to find the best pharmacy that's okay
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
## 2016-06-30 NOTE — Telephone Encounter (Signed)
See below, we will have to submit a PA for the brand synthroid. Ok to start?

## 2016-07-03 NOTE — Telephone Encounter (Signed)
I contacted the pt and advised of note below. Pt stated she would really like for us to try and get the brand name synthroid approved through her insurance. Pt stated last year she ran into this problem with [employer] and was approved for the brand name after the PA was submitted. Pt wanted me to check with you again on this matter. I advised the pt MD was currently out of town and would not be able to review until 07/06/2016. Pt voiced understanding on this and stated she had her medication for the weekend and could wait for him to review.  Please advise, Thanks!

## 2016-07-06 NOTE — Telephone Encounter (Signed)
PA placed on your desk, Thanks! 

## 2016-07-06 NOTE — Telephone Encounter (Signed)
Ok to get PA done

## 2016-07-13 DIAGNOSIS — Z0279 Encounter for issue of other medical certificate: Secondary | ICD-10-CM

## 2016-07-21 ENCOUNTER — Telehealth: Payer: Self-pay | Admitting: Family Medicine

## 2016-07-21 NOTE — Telephone Encounter (Signed)
Derrick w/[employer] wanted to let us know that the patient's Synthroid medication has been approved for one year effective today.  The patient has been notified as well.

## 2016-07-21 NOTE — Telephone Encounter (Signed)
Noted  

## 2016-07-22 ENCOUNTER — Other Ambulatory Visit: Payer: Self-pay | Admitting: Endocrinology

## 2016-07-24 ENCOUNTER — Other Ambulatory Visit: Payer: Self-pay | Admitting: *Deleted

## 2016-07-24 MED ORDER — DULOXETINE HCL 20 MG PO CPEP: 20.0000 mg | ORAL_CAPSULE | Freq: Every day | ORAL | 0 refills | Status: DC

## 2016-07-24 NOTE — Telephone Encounter (Signed)
Medication refill request: Cymbalta Last AEX:  07/19/15 SM Next AEX: 08/24/16 Last MMG (if hormonal medication request): 04/10/16 BIRADS1:neg Refill authorized: 04/02/16 #30caps/10 R.   Pharmacy requesting 90 days supply. Please advise.

## 2016-08-24 ENCOUNTER — Encounter: Payer: Self-pay | Admitting: Obstetrics & Gynecology

## 2016-08-24 ENCOUNTER — Ambulatory Visit (INDEPENDENT_AMBULATORY_CARE_PROVIDER_SITE_OTHER): Payer: Medicare Other | Admitting: Obstetrics & Gynecology

## 2016-08-24 VITALS — BP 118/72 | HR 88 | Resp 18 | Ht 62.75 in | Wt 189.0 lb

## 2016-08-24 DIAGNOSIS — Z01419 Encounter for gynecological examination (general) (routine) without abnormal findings: Secondary | ICD-10-CM

## 2016-08-24 MED ORDER — DULOXETINE HCL 20 MG PO CPEP: 20.0000 mg | ORAL_CAPSULE | Freq: Every day | ORAL | 4 refills | Status: DC
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
## 2016-11-05 ENCOUNTER — Ambulatory Visit: Payer: Medicare Other | Admitting: Obstetrics & Gynecology

## 2017-03-03 ENCOUNTER — Other Ambulatory Visit: Payer: Self-pay | Admitting: Family Medicine

## 2017-03-03 DIAGNOSIS — Z1231 Encounter for screening mammogram for malignant neoplasm of breast: Secondary | ICD-10-CM

## 2017-03-06 ENCOUNTER — Other Ambulatory Visit: Payer: Self-pay | Admitting: Endocrinology

## 2017-05-04 ENCOUNTER — Ambulatory Visit: Payer: Medicare Other

## 2017-05-04 ENCOUNTER — Ambulatory Visit
Admission: RE | Admit: 2017-05-04 | Discharge: 2017-05-04 | Disposition: A | Discharge status: Home / Self Care | Payer: Medicare Other | Source: Ambulatory Visit | Attending: Family Medicine | Admitting: Family Medicine

## 2017-05-04 DIAGNOSIS — Z1231 Encounter for screening mammogram for malignant neoplasm of breast: Secondary | ICD-10-CM

## 2017-06-30 ENCOUNTER — Ambulatory Visit (INDEPENDENT_AMBULATORY_CARE_PROVIDER_SITE_OTHER): Payer: Medicare Other | Admitting: Endocrinology

## 2017-06-30 ENCOUNTER — Encounter: Payer: Self-pay | Admitting: Endocrinology

## 2017-06-30 VITALS — BP 120/74 | HR 71 | Ht 62.75 in | Wt 192.2 lb

## 2017-06-30 DIAGNOSIS — E890 Postprocedural hypothyroidism: Secondary | ICD-10-CM

## 2017-06-30 LAB — TSH: TSH: 0.61 u[IU]/mL (ref 0.35–4.50)

## 2017-06-30 LAB — T4, FREE: Free T4: 1.29 ng/dL (ref 0.60–1.60)
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
## 2017-07-25 ENCOUNTER — Other Ambulatory Visit: Payer: Self-pay | Admitting: Endocrinology

## 2017-08-30 DIAGNOSIS — M797 Fibromyalgia: Secondary | ICD-10-CM | POA: Insufficient documentation

## 2017-08-30 DIAGNOSIS — R112 Nausea with vomiting, unspecified: Secondary | ICD-10-CM | POA: Insufficient documentation

## 2017-09-14 ENCOUNTER — Telehealth: Payer: Self-pay | Admitting: Endocrinology

## 2017-09-14 NOTE — Telephone Encounter (Signed)
That looks fine compared to the previous level, confirm that she is taking 6-1/2 tablets per week

## 2017-09-14 NOTE — Telephone Encounter (Signed)
Patient came in on August 8th. Medication was adjusted. On October 8, TSH was 0.775. Her Primary Care physician concerned. Please advise patient ph# [phone].

## 2017-09-14 NOTE — Telephone Encounter (Signed)
Please advise 

## 2017-09-15 NOTE — Telephone Encounter (Signed)
Called patient and she stated that she is taking the 6 and a half tablets of the Synthroid weekly. She stated that she is doing well and that the doctors office will be sending in a medical records request so they can have her lab records, etc. She will see us in January.

## 2017-09-21 ENCOUNTER — Other Ambulatory Visit: Payer: Self-pay | Admitting: Obstetrics & Gynecology

## 2017-09-21 NOTE — Telephone Encounter (Signed)
Medication refill request: cymbalta  Last AEX:  08/24/16 SM Next AEX: 12/14/17 SM Last MMG (if hormonal medication request): 05/04/17 BIRADS1:neg  Refill authorized: 08/24/16 #90caps/4R. Today please advise.

## 2017-10-22 DIAGNOSIS — Z1211 Encounter for screening for malignant neoplasm of colon: Secondary | ICD-10-CM | POA: Insufficient documentation

## 2017-12-14 ENCOUNTER — Other Ambulatory Visit: Payer: Self-pay

## 2017-12-14 ENCOUNTER — Encounter: Payer: Self-pay | Admitting: Obstetrics & Gynecology

## 2017-12-14 ENCOUNTER — Ambulatory Visit (INDEPENDENT_AMBULATORY_CARE_PROVIDER_SITE_OTHER): Payer: Medicare Other | Admitting: Obstetrics & Gynecology

## 2017-12-14 VITALS — BP 136/80 | HR 72 | Resp 16 | Ht 62.5 in | Wt 179.0 lb

## 2017-12-14 DIAGNOSIS — Z8619 Personal history of other infectious and parasitic diseases: Secondary | ICD-10-CM | POA: Diagnosis not present

## 2017-12-14 DIAGNOSIS — Z0000 Encounter for general adult medical examination without abnormal findings: Secondary | ICD-10-CM | POA: Diagnosis not present

## 2017-12-14 DIAGNOSIS — Z124 Encounter for screening for malignant neoplasm of cervix: Secondary | ICD-10-CM

## 2017-12-14 DIAGNOSIS — Z01419 Encounter for gynecological examination (general) (routine) without abnormal findings: Secondary | ICD-10-CM | POA: Diagnosis not present

## 2017-12-14 DIAGNOSIS — Z23 Encounter for immunization: Secondary | ICD-10-CM | POA: Diagnosis not present

## 2017-12-14 LAB — POCT URINALYSIS DIPSTICK
Bilirubin, UA: NEGATIVE
Glucose, UA: NEGATIVE
KETONES UA: NEGATIVE
Leukocytes, UA: NEGATIVE
NITRITE UA: NEGATIVE
PH UA: 8 (ref 5.0–8.0)
Protein, UA: NEGATIVE
RBC UA: NEGATIVE
UROBILINOGEN UA: 0.2 U/dL

## 2017-12-14 MED ORDER — DULOXETINE HCL 20 MG PO CPEP: 20.0000 mg | ORAL_CAPSULE | Freq: Every day | ORAL | 4 refills | Status: DC
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
## 2017-12-20 ENCOUNTER — Other Ambulatory Visit: Payer: Self-pay | Admitting: Endocrinology

## 2018-01-05 ENCOUNTER — Ambulatory Visit: Payer: Medicare Other | Admitting: Endocrinology

## 2018-01-25 ENCOUNTER — Ambulatory Visit: Payer: Medicare Other | Admitting: Endocrinology

## 2018-03-04 ENCOUNTER — Ambulatory Visit: Payer: Medicare Other | Admitting: Endocrinology

## 2018-03-21 ENCOUNTER — Other Ambulatory Visit: Payer: Self-pay | Admitting: *Deleted

## 2018-03-21 ENCOUNTER — Telehealth: Payer: Self-pay | Admitting: *Deleted

## 2018-03-21 NOTE — Telephone Encounter (Signed)
Spoke with Goodell from [place]. Calling to notify of approval of tier exception for Cymbalta. Approved 03/21/18 -03/22/19. Patient has been notified by [facility] of approval.   Routing to provider for final review.  Will close encounter.

## 2018-03-21 NOTE — Telephone Encounter (Signed)
Spoke with Steward Drone, calling from [facility] to complete tier exception form for cymbalta 20 mg cap, requested by patient. Was advised information will be reviewed by pharmacist and response will be faxed to the office at [phone].   Started Cymbalta  caps 01/09/16 for depression,  chronic fatigue, vasomotor symptoms. Was on HRT prior, low dose cymbalta a safer and possible more effective alternative to HRT.

## 2018-05-16 ENCOUNTER — Telehealth: Payer: Self-pay | Admitting: Endocrinology

## 2018-05-16 ENCOUNTER — Other Ambulatory Visit: Payer: Self-pay

## 2018-05-16 MED ORDER — SYNTHROID 88 MCG PO TABS: ORAL_TABLET | ORAL | 0 refills | Status: AC

## 2018-05-16 NOTE — Telephone Encounter (Signed)
She can have 30-day refill, supposed to be taking 6-1/2 tablets/week.  However needs to make an appointment for follow-up with labs

## 2018-05-16 NOTE — Telephone Encounter (Signed)
Has not been seen since August 2018. Okay to refill?

## 2018-05-16 NOTE — Telephone Encounter (Signed)
Called pt and left detailed voicemail with MD message on correct dosing as well as instructions to make follow up appt with labs. Pt asked to call back and make appt. At earliest convenience.

## 2018-05-16 NOTE — Telephone Encounter (Signed)
Pt called and clarified. Pt stated that she would call at a later date and schedule and appointment with labs

## 2018-05-16 NOTE — Telephone Encounter (Signed)
Patient is returning your call.  

## 2018-07-01 ENCOUNTER — Ambulatory Visit: Payer: Medicare Other | Admitting: Endocrinology

## 2018-07-04 ENCOUNTER — Other Ambulatory Visit: Payer: Self-pay | Admitting: Family Medicine

## 2018-07-04 DIAGNOSIS — Z1231 Encounter for screening mammogram for malignant neoplasm of breast: Secondary | ICD-10-CM

## 2018-07-06 ENCOUNTER — Ambulatory Visit: Payer: Medicare Other | Admitting: Endocrinology

## 2018-07-07 ENCOUNTER — Other Ambulatory Visit: Payer: Self-pay | Admitting: Endocrinology

## 2018-07-08 ENCOUNTER — Ambulatory Visit
Admission: RE | Admit: 2018-07-08 | Discharge: 2018-07-08 | Disposition: A | Discharge status: Home / Self Care | Payer: Medicare Other | Source: Ambulatory Visit | Attending: Family Medicine | Admitting: Family Medicine

## 2018-07-08 DIAGNOSIS — Z1231 Encounter for screening mammogram for malignant neoplasm of breast: Secondary | ICD-10-CM

## 2019-02-14 ENCOUNTER — Ambulatory Visit: Payer: Medicare Other | Admitting: Obstetrics & Gynecology

## 2019-03-30 ENCOUNTER — Other Ambulatory Visit: Payer: Self-pay | Admitting: *Deleted

## 2019-03-30 MED ORDER — DULOXETINE HCL 20 MG PO CPEP: 20.0000 mg | ORAL_CAPSULE | Freq: Every day | ORAL | 0 refills | Status: DC

## 2019-03-30 NOTE — Telephone Encounter (Signed)
Medication refill request: cymbalta 20mg  Last AEX:  12/14/17 SM Next AEX: 07/24/19  Last MMG (if hormonal medication request): 07/08/18 BIRADS1:Neg Refill authorized: 12/14/17 #90caps/4R. Today please advise.

## 2019-06-12 ENCOUNTER — Other Ambulatory Visit: Payer: Self-pay | Admitting: Family Medicine

## 2019-06-12 DIAGNOSIS — Z1231 Encounter for screening mammogram for malignant neoplasm of breast: Secondary | ICD-10-CM

## 2019-07-14 DIAGNOSIS — M109 Gout, unspecified: Secondary | ICD-10-CM | POA: Insufficient documentation

## 2019-07-20 ENCOUNTER — Other Ambulatory Visit: Payer: Self-pay

## 2019-07-24 ENCOUNTER — Other Ambulatory Visit: Payer: Self-pay

## 2019-07-24 ENCOUNTER — Encounter: Payer: Self-pay | Admitting: Obstetrics & Gynecology

## 2019-07-24 ENCOUNTER — Ambulatory Visit (INDEPENDENT_AMBULATORY_CARE_PROVIDER_SITE_OTHER): Payer: Medicare Other | Admitting: Obstetrics & Gynecology

## 2019-07-24 VITALS — BP 110/80 | HR 76 | Temp 97.4°F | Ht 62.75 in | Wt 187.2 lb

## 2019-07-24 DIAGNOSIS — Z01419 Encounter for gynecological examination (general) (routine) without abnormal findings: Secondary | ICD-10-CM

## 2019-07-24 MED ORDER — DULOXETINE HCL 20 MG PO CPEP: 20.0000 mg | ORAL_CAPSULE | Freq: Every day | ORAL | 4 refills | Status: DC
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
## 2019-08-16 ENCOUNTER — Other Ambulatory Visit: Payer: Self-pay

## 2019-08-16 ENCOUNTER — Ambulatory Visit
Admission: RE | Admit: 2019-08-16 | Discharge: 2019-08-16 | Disposition: A | Discharge status: Home / Self Care | Payer: Medicare Other | Source: Ambulatory Visit | Attending: Family Medicine | Admitting: Family Medicine

## 2019-08-16 DIAGNOSIS — Z1231 Encounter for screening mammogram for malignant neoplasm of breast: Secondary | ICD-10-CM

## 2020-07-31 ENCOUNTER — Other Ambulatory Visit: Payer: Self-pay | Admitting: *Deleted

## 2020-07-31 NOTE — Telephone Encounter (Signed)
Medication refill request: Duloxetine  Last AEX:  07-24-2019 SM  Next AEX: 10-01-20 Last MMG (if hormonal medication request): n/a Refill authorized: Today, please advise.   Medication pended for #90, 0RF. Please refill if appropriate.

## 2020-08-02 MED ORDER — DULOXETINE HCL 20 MG PO CPEP: 20.0000 mg | ORAL_CAPSULE | Freq: Every day | ORAL | 0 refills | Status: AC

## 2020-10-01 ENCOUNTER — Ambulatory Visit: Payer: Medicare Other | Admitting: Obstetrics & Gynecology

## 2020-10-07 ENCOUNTER — Ambulatory Visit: Payer: Medicare Other | Admitting: Obstetrics & Gynecology

## 2020-12-30 ENCOUNTER — Other Ambulatory Visit: Payer: Self-pay | Admitting: Family Medicine

## 2020-12-30 DIAGNOSIS — Z1231 Encounter for screening mammogram for malignant neoplasm of breast: Secondary | ICD-10-CM

## 2020-12-31 ENCOUNTER — Other Ambulatory Visit: Payer: Self-pay | Admitting: Family Medicine

## 2020-12-31 DIAGNOSIS — R599 Enlarged lymph nodes, unspecified: Secondary | ICD-10-CM

## 2021-01-30 ENCOUNTER — Other Ambulatory Visit: Payer: Medicare Other
